# Patient Record
Sex: Female | Born: 1995 | Race: Black or African American | Hispanic: No | Marital: Single | Smoking: Never smoker
Health system: Southern US, Community
[De-identification: ages and names within clinical notes are randomized; demographics above are authoritative.]

---
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
## 2017-11-13 ENCOUNTER — Telehealth (HOSPITAL_COMMUNITY): Payer: Self-pay | Admitting: Emergency Medicine

## 2017-11-13 ENCOUNTER — Ambulatory Visit (HOSPITAL_COMMUNITY)
Admission: EM | Admit: 2017-11-13 | Discharge: 2017-11-13 | Disposition: A | Discharge status: Home / Self Care | Payer: BLUE CROSS/BLUE SHIELD | Attending: Family Medicine | Admitting: Family Medicine

## 2017-11-13 ENCOUNTER — Encounter (HOSPITAL_COMMUNITY): Payer: Self-pay | Admitting: Emergency Medicine

## 2017-11-13 DIAGNOSIS — J029 Acute pharyngitis, unspecified: Secondary | ICD-10-CM | POA: Diagnosis present

## 2017-11-13 DIAGNOSIS — J039 Acute tonsillitis, unspecified: Secondary | ICD-10-CM

## 2017-11-13 LAB — POCT RAPID STREP A: STREPTOCOCCUS, GROUP A SCREEN (DIRECT): NEGATIVE

## 2017-11-13 MED ORDER — AMOXICILLIN 400 MG/5ML PO SUSR: 500.0000 mg | Freq: Two times a day (BID) | ORAL | 0 refills | Status: DC

## 2017-11-13 MED ORDER — AMOXICILLIN 400 MG/5ML PO SUSR: 500.0000 mg | Freq: Two times a day (BID) | ORAL | 0 refills | Status: AC

## 2017-11-13 MED ORDER — ACETAMINOPHEN 160 MG/5ML PO SOLN
ORAL | Status: AC
Start: 2017-11-13 — End: 2017-11-13
  Filled 2017-11-13: qty 20.3

## 2017-11-13 MED ORDER — LIDOCAINE VISCOUS HCL 2 % MT SOLN: OROMUCOSAL | 0 refills | Status: DC

## 2017-11-13 MED ORDER — ACETAMINOPHEN 160 MG/5ML PO SOLN
650.0000 mg | Freq: Once | ORAL | Status: AC
  Administered 2017-11-13: 650 mg via ORAL

## 2017-11-13 NOTE — ED Provider Notes (Signed)
[facility]    CSN: [id number] Arrival date & time: 11/13/17  1920     History   Chief Complaint Chief Complaint  Patient presents with  . Sore Throat    HPI Barbara Wolf is a 22 y.o. female.   22 year old female comes in for 2-day history of sore throat.  Has had subjective fever.  Denies rhinorrhea, nasal congestion, cough.  Has a painful swallowing, and therefore has been avoiding eating or drinking.  Without trouble breathing, drooling, tripoding, trismus.  No obvious sick contact.     History reviewed. No pertinent past medical history.  There are no active problems to display for this patient.   History reviewed. No pertinent surgical history.  OB History   None      Home Medications    Prior to Admission medications   Medication Sig Start Date End Date Taking? Authorizing Provider  amoxicillin (AMOXIL) 400 MG/5ML suspension Take 6.3 mLs (500 mg total) by mouth 2 (two) times daily for 10 days. 11/13/17 11/23/17  Belinda Fisher, PA-C  lidocaine (XYLOCAINE) 2 % solution 5-15 mL gurgle as needed 11/13/17   Belinda Fisher, PA-C    Family History No family history on file.  Social History Social History   Tobacco Use  . Smoking status: Not on file  Substance Use Topics  . Alcohol use: No  . Drug use: Not on file     Allergies   Patient has no known allergies.   Review of Systems Review of Systems  Reason unable to perform ROS: See HPI as above.     Physical Exam Triage Vital Signs ED Triage Vitals  Enc Vitals Group     BP 11/13/17 1955 108/74     Pulse Rate 11/13/17 1953 (!) 130     Resp 11/13/17 1953 18     Temp 11/13/17 1953 (!) 102.9 F (39.4 C)     Temp src --      SpO2 11/13/17 1953 97 %     Weight --      Height --      Head Circumference --      Peak Flow --      Pain Score 11/13/17 1955 10     Pain Loc --      Pain Edu? --      Excl. in GC? --    No data found.  Updated Vital Signs BP 108/74   Pulse (!) 130    Temp (!) 102.9 F (39.4 C)   Resp 18   LMP 11/13/2017   SpO2 97%  Oh Physical Exam  Constitutional: She is oriented to person, place, and time. She appears well-developed and well-nourished.  Non-toxic appearance. She does not appear ill. No distress.  HENT:  Head: Normocephalic and atraumatic.  Right Ear: Tympanic membrane, external ear and ear canal normal. Tympanic membrane is not erythematous and not bulging.  Left Ear: Tympanic membrane, external ear and ear canal normal. Tympanic membrane is not erythematous and not bulging.  Nose: Nose normal. Right sinus exhibits no maxillary sinus tenderness and no frontal sinus tenderness. Left sinus exhibits no maxillary sinus tenderness and no frontal sinus tenderness.  Mouth/Throat: Uvula is midline and mucous membranes are normal. Posterior oropharyngeal erythema present. Tonsils are 2+ on the right. Tonsils are 2+ on the left. Tonsillar exudate.  Eyes: Pupils are equal, round, and reactive to light. Conjunctivae are normal.  Neck: Normal range of motion. Neck supple.  Cardiovascular: Normal rate,  regular rhythm and normal heart sounds. Exam reveals no gallop and no friction rub.  No murmur heard. Pulmonary/Chest: Effort normal and breath sounds normal. No stridor. No respiratory distress. She has no decreased breath sounds. She has no wheezes. She has no rhonchi. She has no rales.  Lymphadenopathy:    She has no cervical adenopathy.  Neurological: She is alert and oriented to person, place, and time.  Skin: Skin is warm and dry.  Psychiatric: She has a normal mood and affect. Her behavior is normal. Judgment normal.     UC Treatments / Results  Labs (all labs ordered are listed, but only abnormal results are displayed) Labs Reviewed  CULTURE, GROUP A STREP [facility])  POCT RAPID STREP A    EKG None  Radiology No results found.  Procedures Procedures (including critical care time)  Medications Ordered in UC Medications    acetaminophen (TYLENOL) solution 650 mg (650 mg Oral Given 11/13/17 1959)    Initial Impression / Assessment and Plan / UC Course  I have reviewed the triage vital signs and the nursing notes.  Pertinent labs & imaging results that were available during my care of the patient were reviewed by me and considered in my medical decision making (see chart for details).    Rapid strep negative.  However, given history and exam, will cover for tonsillitis with amoxicillin.  Other symptomatic treatment discussed.  Return precautions given.  Patient expresses understanding and agrees to plan.  Final Clinical Impressions(s) / UC Diagnoses   Final diagnoses:  Tonsillitis    ED Prescriptions    Medication Sig Dispense Auth. Provider   amoxicillin (AMOXIL) 400 MG/5ML suspension Take 6.3 mLs (500 mg total) by mouth 2 (two) times daily for 10 days. 130 mL Breona Cherubin V, PA-C   lidocaine (XYLOCAINE) 2 % solution 5-15 mL gurgle as needed 150 mL Threasa Alpha, [place] 11/13/17 2017

## 2017-11-13 NOTE — ED Triage Notes (Signed)
Pt c/o sore throat x2 days 

## 2017-11-13 NOTE — Discharge Instructions (Signed)
Rapid strep negative. However, given your exam, will cover you empirically for bacterial infection with amoxicillin. As discussed, symptoms can still be due to viral illness/ drainage down your throat. This usually takes 7-10 days to resolve. Start lidocaine for sore throat, do not eat or drink for the next 40 mins after use as it can stunt your gag reflex. You can take over the counter allergy medicine such as Flonase, Zyrtec-D for nasal congestion/drainage. You can use over the counter nasal saline rinse such as neti pot for nasal congestion. Monitor for any worsening of symptoms, swelling of the throat, trouble breathing, trouble swallowing, leaning forward to breath, drooling, go to the emergency department for further evaluation needed.  

## 2017-11-14 ENCOUNTER — Encounter (HOSPITAL_COMMUNITY): Payer: Self-pay

## 2017-11-14 ENCOUNTER — Emergency Department (HOSPITAL_COMMUNITY)
Admission: EM | Admit: 2017-11-14 | Discharge: 2017-11-14 | Disposition: A | Discharge status: Home / Self Care | Payer: BLUE CROSS/BLUE SHIELD | Attending: Emergency Medicine | Admitting: Emergency Medicine

## 2017-11-14 ENCOUNTER — Other Ambulatory Visit: Payer: Self-pay

## 2017-11-14 DIAGNOSIS — J029 Acute pharyngitis, unspecified: Secondary | ICD-10-CM | POA: Diagnosis not present

## 2017-11-14 DIAGNOSIS — R070 Pain in throat: Secondary | ICD-10-CM | POA: Diagnosis present

## 2017-11-14 LAB — CBC WITH DIFFERENTIAL/PLATELET
Abs Immature Granulocytes: 0.22 10*3/uL — ABNORMAL HIGH (ref 0.00–0.07)
BASOS ABS: 0 10*3/uL (ref 0.0–0.1)
Basophils Relative: 0 %
EOS ABS: 0 10*3/uL (ref 0.0–0.5)
Eosinophils Relative: 0 %
HCT: 35.1 % — ABNORMAL LOW (ref 36.0–46.0)
HEMOGLOBIN: 12 g/dL (ref 12.0–15.0)
Immature Granulocytes: 1 %
LYMPHS ABS: 1 10*3/uL (ref 0.7–4.0)
LYMPHS PCT: 5 %
MCH: 29.6 pg (ref 26.0–34.0)
MCHC: 34.2 g/dL (ref 30.0–36.0)
MCV: 86.5 fL (ref 80.0–100.0)
MONO ABS: 2.7 10*3/uL — AB (ref 0.1–1.0)
Monocytes Relative: 12 %
NRBC: 0 % (ref 0.0–0.2)
Neutro Abs: 18.1 10*3/uL — ABNORMAL HIGH (ref 1.7–7.7)
Neutrophils Relative %: 82 %
Platelets: 180 10*3/uL (ref 150–400)
RBC: 4.06 MIL/uL (ref 3.87–5.11)
RDW: 11.9 % (ref 11.5–15.5)
WBC: 22 10*3/uL — AB (ref 4.0–10.5)

## 2017-11-14 LAB — MONONUCLEOSIS SCREEN: Mono Screen: NEGATIVE

## 2017-11-14 MED ORDER — ACETAMINOPHEN 325 MG PO TABS
650.0000 mg | ORAL_TABLET | Freq: Once | ORAL | Status: DC
  Filled 2017-11-14: qty 2

## 2017-11-14 MED ORDER — ACETAMINOPHEN 160 MG/5ML PO SOLN
650.0000 mg | Freq: Once | ORAL | Status: AC
  Administered 2017-11-14: 650 mg via ORAL
  Filled 2017-11-14: qty 20.3

## 2017-11-14 MED ORDER — DEXAMETHASONE 1 MG/ML PO CONC
10.0000 mg | Freq: Once | ORAL | Status: AC
  Administered 2017-11-14: 10 mg via ORAL
  Filled 2017-11-14: qty 10

## 2017-11-14 MED ORDER — SODIUM CHLORIDE 0.9 % IV BOLUS
1000.0000 mL | Freq: Once | INTRAVENOUS | Status: AC
  Administered 2017-11-14: 1000 mL via INTRAVENOUS

## 2017-11-14 NOTE — ED Provider Notes (Signed)
[facility] EMERGENCY DEPARTMENT Provider Note   CSN: [id number] Arrival date & time: 11/14/17  1454     History   Chief Complaint Chief Complaint  Patient presents with  . Sore Throat    HPI Barbara Wolf is a 22 y.o. female who presents with fever and sore throat. No significant PMH. Mom is at bedside. The patient started to have a sore throat and fever three days ago. They went to UC last night and was prescribed Amoxicillin despite having negative strep due to her physical exam. Today they are back because she is not feeling better and pain in her throat is worsening. She tried lidocaine solution without relief. She denies any SOB but has difficulty swallowing. She reports not eating or drinking in three days. She has been taking Amoxicillin. No known sick contacts.  HPI  History reviewed. No pertinent past medical history.  There are no active problems to display for this patient.   History reviewed. No pertinent surgical history.   OB History   None      Home Medications    Prior to Admission medications   Medication Sig Start Date End Date Taking? Authorizing Provider  amoxicillin (AMOXIL) 400 MG/5ML suspension Take 6.3 mLs (500 mg total) by mouth 2 (two) times daily for 10 days. 11/13/17 11/23/17  Belinda Fisher, PA-C  lidocaine (XYLOCAINE) 2 % solution 5-15 mL gurgle as needed 11/13/17   Belinda Fisher, PA-C    Family History No family history on file.  Social History Social History   Tobacco Use  . Smoking status: Not on file  Substance Use Topics  . Alcohol use: No  . Drug use: Not on file     Allergies   Patient has no known allergies.   Review of Systems Review of Systems  Constitutional: Positive for fever.  HENT: Positive for sore throat and trouble swallowing. Negative for ear pain.   Respiratory: Negative for shortness of breath.   Gastrointestinal: Negative for vomiting.  Allergic/Immunologic: Negative for immunocompromised  state.  All other systems reviewed and are negative.    Physical Exam Updated Vital Signs BP (!) 131/92 (BP Location: Left Arm)   Pulse (!) 133   Temp (!) 100.7 F (38.2 C) (Oral)   Resp 18   Ht 5\' 2"  (1.575 m)   Wt 48.5 kg   LMP 11/13/2017   SpO2 100%   BMI 19.57 kg/m   Physical Exam  Constitutional: She is oriented to person, place, and time. She appears well-developed and well-nourished. No distress.  Calm and cooperative. Not talking.  HENT:  Head: Normocephalic and atraumatic.  Right Ear: Hearing, tympanic membrane, external ear and ear canal normal.  Left Ear: Hearing, tympanic membrane, external ear and ear canal normal.  Nose: Nose normal.  Mouth/Throat: Uvula is midline. Mucous membranes are dry. Oropharyngeal exudate, posterior oropharyngeal edema and posterior oropharyngeal erythema present. No tonsillar abscesses. Tonsils are 3+ on the right. Tonsils are 3+ on the left. Tonsillar exudate.  Eyes: Pupils are equal, round, and reactive to light. Conjunctivae are normal. Right eye exhibits no discharge. Left eye exhibits no discharge. No scleral icterus.  Neck: Normal range of motion.  Cardiovascular: Normal rate.  Pulmonary/Chest: Effort normal. No respiratory distress.  Abdominal: She exhibits no distension.  Neurological: She is alert and oriented to person, place, and time.  Skin: Skin is warm and dry.  Psychiatric: She has a normal mood and affect. Her behavior is normal.  Nursing  note and vitals reviewed.    ED Treatments / Results  Labs (all labs ordered are listed, but only abnormal results are displayed) Labs Reviewed  CBC WITH DIFFERENTIAL/PLATELET - Abnormal; Notable for the following components:      Result Value   WBC 22.0 (*)    HCT 35.1 (*)    Neutro Abs 18.1 (*)    Monocytes Absolute 2.7 (*)    Abs Immature Granulocytes 0.22 (*)    All other components within normal limits  MONONUCLEOSIS SCREEN    EKG None  Radiology No results  found.  Procedures Procedures (including critical care time)  Medications Ordered in ED Medications  acetaminophen (TYLENOL) solution 650 mg (650 mg Oral Given 11/14/17 1522)  sodium chloride 0.9 % bolus 1,000 mL (0 mLs Intravenous Stopped 11/14/17 1726)  dexamethasone (DECADRON) 1 MG/ML solution 10 mg (10 mg Oral Given 11/14/17 1701)     Initial Impression / Assessment and Plan / ED Course  I have reviewed the triage vital signs and the nursing notes.  Pertinent labs & imaging results that were available during my care of the patient were reviewed by me and considered in my medical decision making (see chart for details).  22 year old female with fever and sore throat for three days. She is febrile and tachycardic to 130 here. She is tolerating secretions but not talking. On exam she has an significantly swollen oropharynx and tonsils. No uvula deviation so doubt PTA at this time. CBC and mono screen were ordered by [facility]. CBC is remarkable for leukocytosis of 22 with neutrophils and monocytes. Strep was not repeated since she has been taking Amoxil. Since she has not ate or drank in 3 days and her heart rate is still elevated after fever has resolved will give fluids, decadron, and reassess.  Fever and tachycardia have resolved with fluids and Tylenol. She has tolerated PO here. Mono is negative. Strep culture is still pending. Advised continuing Amoxil, encourage fluids, and f/u with ENT next week. Strict return precautions given.  Final Clinical Impressions(s) / ED Diagnoses   Final diagnoses:  Pharyngitis, unspecified etiology    ED Discharge Orders    None       Bethel Born, PA-C 11/14/17 1845    Raeford Razor, MD 11/15/17 [phone]

## 2017-11-14 NOTE — Discharge Instructions (Signed)
Please continue Amoxicillin Drink plenty of fluids and encourage soft foods (Gatorade, Pedialyte, yogurt, ice cream, soup) Give Tylenol for fever and pain Follow up with ENT if not improving Return to the ED if you are worsening

## 2017-11-14 NOTE — ED Provider Notes (Signed)
Patient placed in Quick Look pathway, seen and evaluated   Chief Complaint: sore throat and fever  HPI: Barbara Wolf is a 22 y.o. female who presents to the ED with sore throat and fever that started 3 days ago and has gotten worse. Patient went to Urgent Care 2 days ago and tested negative for strep but was started on Amoxicillin. Patient crying today and states she feels worse.   ROS: ENT: sore throat  Physical Exam:  BP (!) 131/92 (BP Location: Left Arm)   Pulse (!) 133   Temp (!) 100.7 F (38.2 C) (Oral)   Resp 18   Ht 5\' 2"  (1.575 m)   Wt 48.5 kg   LMP 11/13/2017   SpO2 100%   BMI 19.57 kg/m    Gen: No distress  Neuro: Awake and Alert  Skin: Warm and dry  ENT: throat with erythema and exudate bilateral   Initiation of care has begun. The patient has been counseled on the process, plan, and necessity for staying for the completion/evaluation, and the remainder of the medical screening examination    Janne Napoleon, NP 11/14/17 1507    Lorre Nick, MD 11/15/17 [phone]

## 2017-11-14 NOTE — ED Triage Notes (Signed)
Pt presents for sore throat and fever x 3 days.

## 2017-11-16 LAB — CULTURE, GROUP A STREP (THRC)

## 2018-09-27 DIAGNOSIS — H5203 Hypermetropia, bilateral: Secondary | ICD-10-CM | POA: Diagnosis not present

## 2018-11-14 ENCOUNTER — Ambulatory Visit (INDEPENDENT_AMBULATORY_CARE_PROVIDER_SITE_OTHER): Payer: 59 | Admitting: Obstetrics & Gynecology

## 2018-11-14 ENCOUNTER — Encounter: Payer: Self-pay | Admitting: Obstetrics & Gynecology

## 2018-11-14 ENCOUNTER — Other Ambulatory Visit: Payer: Self-pay

## 2018-11-14 VITALS — BP 107/76 | HR 87 | Wt 123.5 lb

## 2018-11-14 DIAGNOSIS — Z113 Encounter for screening for infections with a predominantly sexual mode of transmission: Secondary | ICD-10-CM | POA: Diagnosis not present

## 2018-11-14 DIAGNOSIS — Z124 Encounter for screening for malignant neoplasm of cervix: Secondary | ICD-10-CM

## 2018-11-14 DIAGNOSIS — Z01419 Encounter for gynecological examination (general) (routine) without abnormal findings: Secondary | ICD-10-CM | POA: Diagnosis not present

## 2018-11-14 NOTE — Progress Notes (Signed)
Subjective:    Barbara Wolf is a 23 y.o. single G0 who presents for an annual exam. The patient has no complaints today. The patient is sexually active. GYN screening history: no prior history of gyn screening tests. The patient wears seatbelts: yes. The patient participates in regular exercise: yes. Has the patient ever been transfused or tattooed?: no. The patient reports that there is not domestic violence in her life.   Menstrual History: OB History   No obstetric history on file.     Menarche age: 88 Patient's last menstrual period was 11/11/2018 (exact date). Period Cycle (Days): 27 Period Duration (Days): 7 Period Pattern: Regular Menstrual Flow: Moderate Menstrual Control: Tampon, Maxi pad Menstrual Control Change Freq (Hours): every 3 hours Dysmenorrhea: (!) Mild Dysmenorrhea Symptoms: Cramping  The following portions of the patient's history were reviewed and updated as appropriate: allergies, current medications, past family history, past medical history, past social history, past surgical history and problem list.  Review of Systems Pertinent items are noted in HPI.   Senior at [employer], studying psychology, plans grad school Monogamous for 4 months, no dyspareunia Using condoms faithfully [place]- + breast- great gm, no gyn/colon cancer She had Gardasil in the past    Objective:    BP 107/76   Pulse 87   Wt 123 lb 8 oz (56 kg)   LMP 11/11/2018 (Exact Date)   BMI 22.59 kg/m   General Appearance:    Alert, cooperative, no distress, appears stated age  Head:    Normocephalic, without obvious abnormality, atraumatic  Eyes:    PERRL, conjunctiva/corneas clear, EOM's intact, fundi    benign, both eyes  Ears:    Normal TM's and external ear canals, both ears  Nose:   Nares normal, septum midline, mucosa normal, no drainage    or sinus tenderness  Throat:   Lips, mucosa, and tongue normal; teeth and gums normal  Neck:   Supple, symmetrical, trachea midline, no adenopathy;     thyroid:  no enlargement/tenderness/nodules; no carotid   bruit or JVD  Back:     Symmetric, no curvature, ROM normal, no CVA tenderness  Lungs:     Clear to auscultation bilaterally, respirations unlabored  Chest Wall:    No tenderness or deformity   Heart:    Regular rate and rhythm, S1 and S2 normal, no murmur, rub   or gallop  Breast Exam:    No tenderness, masses, or nipple abnormality  Abdomen:     Soft, non-tender, bowel sounds active all four quadrants,    no masses, no organomegaly  Genitalia:    Normal female without lesion, discharge or tenderness, normal size and shape, retroverted, mobile, non-tender, normal adnexal exam      Extremities:   Extremities normal, atraumatic, no cyanosis or edema  Pulses:   2+ and symmetric all extremities  Skin:   Skin color, texture, turgor normal, no rashes or lesions  Lymph nodes:   Cervical, supraclavicular, and axillary nodes normal  Neurologic:   CNII-XII intact, normal strength, sensation and reflexes    throughout  .    Assessment:    Healthy female exam.    Plan:     Thin prep Pap smear.   Patient requests STI testing ( wants HSV also)

## 2018-11-15 LAB — HEPATITIS C ANTIBODY: Hep C Virus Ab: <0.1 s/co ratio (ref 0.0–0.9)

## 2018-11-15 LAB — RPR: RPR Ser Ql: NONREACTIVE

## 2018-11-15 LAB — HEPATITIS B SURFACE ANTIGEN: Hepatitis B Surface Ag: NEGATIVE

## 2018-11-15 LAB — HIV ANTIBODY (ROUTINE TESTING W REFLEX): HIV Screen 4th Generation wRfx: NONREACTIVE

## 2018-11-15 LAB — HSV 2 ANTIBODY, IGG: HSV 2 IgG, Type Spec: <0.91 index (ref 0.00–0.90)

## 2018-11-19 LAB — CYTOLOGY - PAP
Chlamydia: NEGATIVE
Comment: NEGATIVE
Comment: NORMAL
Diagnosis: NEGATIVE
Diagnosis: REACTIVE
Neisseria Gonorrhea: NEGATIVE

## 2019-02-25 ENCOUNTER — Other Ambulatory Visit: Payer: Self-pay

## 2019-02-25 ENCOUNTER — Ambulatory Visit (INDEPENDENT_AMBULATORY_CARE_PROVIDER_SITE_OTHER): Payer: Self-pay | Admitting: Nurse Practitioner

## 2019-02-25 ENCOUNTER — Encounter: Payer: Self-pay | Admitting: Nurse Practitioner

## 2019-02-25 VITALS — BP 107/77 | HR 96 | Ht 63.0 in | Wt 125.9 lb

## 2019-02-25 DIAGNOSIS — B9689 Other specified bacterial agents as the cause of diseases classified elsewhere: Secondary | ICD-10-CM

## 2019-02-25 DIAGNOSIS — Z113 Encounter for screening for infections with a predominantly sexual mode of transmission: Secondary | ICD-10-CM

## 2019-02-25 DIAGNOSIS — N898 Other specified noninflammatory disorders of vagina: Secondary | ICD-10-CM

## 2019-02-25 DIAGNOSIS — N760 Acute vaginitis: Secondary | ICD-10-CM

## 2019-02-25 NOTE — Progress Notes (Signed)
Pt states has been having thick white d/c with odor for a couple of weeks now.

## 2019-02-25 NOTE — Progress Notes (Signed)
   GYNECOLOGY OFFICE VISIT NOTE   History:  24 y.o. No obstetric history on file. here today for vaginal discharge with a fishy odor that comes and goes for the past few weeks. She denies any abnormal vaginal discharge, bleeding, pelvic pain or other concerns.   Menstrual cycles are 6 days in length with heavy bleeding at the beginning.  Uses condoms for contraception.  Last intercourse was in November 2020  History reviewed. No pertinent past medical history.  History reviewed. No pertinent surgical history.  The following portions of the patient's history were reviewed and updated as appropriate: allergies, current medications, past family history, past medical history, past social history, past surgical history and problem list.   Health Maintenance:  Normal pap and negative HRHPV in Oct 2020    Review of Systems:  Pertinent items noted in HPI and remainder of comprehensive ROS otherwise negative.  Objective:  Physical Exam BP 107/77   Pulse 96   Ht 5\' 3"  (1.6 m)   Wt 125 lb 14.4 oz (57.1 kg)   LMP 02/11/2019 (Approximate)   BMI 22.30 kg/m  CONSTITUTIONAL: Well-developed, well-nourished female in no acute distress.  HENT:  Normocephalic, atraumatic. External right and left ear normal.  EYES: Conjunctivae and EOM are normal. Pupils are equal, round.  No scleral icterus.  NECK: Normal range of motion, supple, no masses SKIN: Skin is warm and dry. No rash noted. Not diaphoretic. No erythema. No pallor. NEUROLOGIC: Alert and oriented to person, place, and time. Normal muscle tone coordination. No cranial nerve deficit noted. PSYCHIATRIC: Normal mood and affect. Normal behavior. Normal judgment and thought content. CARDIOVASCULAR: Normal heart rate noted RESPIRATORY: Effort and breath sounds normal, no problems with respiration noted ABDOMEN: Soft, no distention noted.   PELVIC: Deferred  Self swab performed. MUSCULOSKELETAL: Normal range of motion. No edema noted.  Labs and  Imaging No results found.  Assessment & Plan:  1. Discharge from the vagina Will contact client with results through MyChart later this week. - Cervicovaginal ancillary only( [place])   Routine preventative health maintenance measures emphasized. Please refer to After Visit Summary for other counseling recommendations.   Return in about 9 months (around 11/16/2019) for annual exam.   Total face-to-face time with patient: 10 minutes.  Over 50% of encounter was spent on counseling and coordination of care.  13/01/2019, RN, MSN, NP-BC Nurse Practitioner, [facility] 02/25/2019 3:44 PM

## 2019-02-26 LAB — CERVICOVAGINAL ANCILLARY ONLY
Bacterial Vaginitis (gardnerella): POSITIVE — AB
Candida Glabrata: NEGATIVE
Candida Vaginitis: NEGATIVE
Chlamydia: NEGATIVE
Comment: NEGATIVE
Comment: NEGATIVE
Comment: NEGATIVE
Comment: NEGATIVE
Comment: NEGATIVE
Comment: NORMAL
Neisseria Gonorrhea: NEGATIVE
Trichomonas: NEGATIVE

## 2019-02-26 MED ORDER — METRONIDAZOLE 500 MG PO TABS: 500.0000 mg | ORAL_TABLET | Freq: Two times a day (BID) | ORAL | 0 refills | Status: DC

## 2019-02-26 NOTE — Addendum Note (Signed)
Addended by: Currie Paris on: 02/26/2019 08:33 PM   Modules accepted: Orders

## 2019-08-13 ENCOUNTER — Telehealth (INDEPENDENT_AMBULATORY_CARE_PROVIDER_SITE_OTHER): Payer: No Typology Code available for payment source | Admitting: Internal Medicine

## 2019-08-13 ENCOUNTER — Encounter: Payer: Self-pay | Admitting: Internal Medicine

## 2019-08-13 DIAGNOSIS — Z7689 Persons encountering health services in other specified circumstances: Secondary | ICD-10-CM

## 2019-08-13 NOTE — Patient Instructions (Signed)
Thank you for choosing [facility] at [facility] to be your medical home!    Barbara Wolf was seen by De Hollingshead, DO today.   Barbara Wolf's primary care provider is Marcy Siren, DO.   For the best care possible, you should try to see Marcy Siren, DO whenever you come to the clinic.   We look forward to seeing you again soon!  If you have any questions about your visit today, please call us at [phone] or feel free to reach your primary care provider via MyChart.

## 2019-08-13 NOTE — Progress Notes (Signed)
Virtual Visit via Telephone Note  I connected with Barbara Wolf, on 08/13/2019 at 8:45 AM by telephone due to the COVID-19 pandemic and verified that I am speaking with the correct person using two identifiers.   Consent: I discussed the limitations, risks, security and privacy concerns of performing an evaluation and management service by telephone and the availability of in person appointments. I also discussed with the patient that there may be a patient responsible charge related to this service. The patient expressed understanding and agreed to proceed.   Location of Patient: Home   Location of Provider: Clinic    Persons participating in Telemedicine visit: Dejae T Janyia Guion [facility] Dr. Earlene Plater      History of Present Illness: Patient has a visit to establish care. No concerns. Looking for a new PCP. Wants to get a physical exam and PPD test done for a new job.   Was diagnosed with asthma in 7th or 8th grade in [place] but has not been told she has it since then. Does not use an inhaler.   No surgical history. No prescription medications.    No past medical history on file. No Known Allergies  No current outpatient medications on file prior to visit.   No current facility-administered medications on file prior to visit.    Observations/Objective: NAD. Speaking clearly.  Work of breathing normal.  Alert and oriented. Mood appropriate.   Assessment and Plan: 1. Encounter to establish care Reviewed patient's PMH, social history, surgical history, and medications.  Is overdue for annual exam, screening blood work, and health maintenance topics. Have asked patient to return for visit to address these items.    Follow Up Instructions: Annual exam   I discussed the assessment and treatment plan with the patient. The patient was provided an opportunity to ask questions and all were answered. The patient agreed with the plan and demonstrated an  understanding of the instructions.   The patient was advised to call back or seek an in-person evaluation if the symptoms worsen or if the condition fails to improve as anticipated.     I provided 8 minutes total of non-face-to-face time during this encounter including median intraservice time, reviewing previous notes, investigations, ordering medications, medical decision making, coordinating care and patient verbalized understanding at the end of the visit.    Marcy Siren, D.O. [facility]  08/13/2019, 8:45 AM

## 2019-08-15 DIAGNOSIS — Z111 Encounter for screening for respiratory tuberculosis: Secondary | ICD-10-CM | POA: Diagnosis not present

## 2019-08-15 DIAGNOSIS — Z021 Encounter for pre-employment examination: Secondary | ICD-10-CM | POA: Diagnosis not present

## 2019-08-17 DIAGNOSIS — Z111 Encounter for screening for respiratory tuberculosis: Secondary | ICD-10-CM | POA: Diagnosis not present

## 2019-08-22 DIAGNOSIS — H5203 Hypermetropia, bilateral: Secondary | ICD-10-CM | POA: Diagnosis not present

## 2019-09-17 DIAGNOSIS — Z20822 Contact with and (suspected) exposure to covid-19: Secondary | ICD-10-CM | POA: Diagnosis not present

## 2019-10-01 ENCOUNTER — Ambulatory Visit (INDEPENDENT_AMBULATORY_CARE_PROVIDER_SITE_OTHER): Payer: No Typology Code available for payment source | Admitting: Internal Medicine

## 2019-12-09 DIAGNOSIS — Z20822 Contact with and (suspected) exposure to covid-19: Secondary | ICD-10-CM | POA: Diagnosis not present

## 2020-02-25 DIAGNOSIS — Z20822 Contact with and (suspected) exposure to covid-19: Secondary | ICD-10-CM | POA: Diagnosis not present

## 2020-07-31 DIAGNOSIS — Z20822 Contact with and (suspected) exposure to covid-19: Secondary | ICD-10-CM | POA: Diagnosis not present

## 2020-08-21 DIAGNOSIS — H5203 Hypermetropia, bilateral: Secondary | ICD-10-CM | POA: Diagnosis not present

## 2020-11-28 DIAGNOSIS — R69 Illness, unspecified: Secondary | ICD-10-CM | POA: Diagnosis not present

## 2020-12-12 DIAGNOSIS — R69 Illness, unspecified: Secondary | ICD-10-CM | POA: Diagnosis not present

## 2020-12-27 DIAGNOSIS — R69 Illness, unspecified: Secondary | ICD-10-CM | POA: Diagnosis not present

## 2021-01-19 DIAGNOSIS — R69 Illness, unspecified: Secondary | ICD-10-CM | POA: Diagnosis not present

## 2021-01-26 DIAGNOSIS — R69 Illness, unspecified: Secondary | ICD-10-CM | POA: Diagnosis not present

## 2021-02-02 DIAGNOSIS — R69 Illness, unspecified: Secondary | ICD-10-CM | POA: Diagnosis not present

## 2021-02-06 ENCOUNTER — Emergency Department (HOSPITAL_COMMUNITY): Payer: No Typology Code available for payment source

## 2021-02-06 ENCOUNTER — Other Ambulatory Visit: Payer: Self-pay

## 2021-02-06 ENCOUNTER — Emergency Department (HOSPITAL_COMMUNITY)
Admission: EM | Admit: 2021-02-06 | Discharge: 2021-02-06 | Disposition: A | Discharge status: Home / Self Care | Payer: No Typology Code available for payment source | Attending: Emergency Medicine | Admitting: Emergency Medicine

## 2021-02-06 ENCOUNTER — Encounter (HOSPITAL_COMMUNITY): Payer: Self-pay

## 2021-02-06 DIAGNOSIS — R079 Chest pain, unspecified: Secondary | ICD-10-CM | POA: Diagnosis not present

## 2021-02-06 DIAGNOSIS — R0789 Other chest pain: Secondary | ICD-10-CM | POA: Insufficient documentation

## 2021-02-06 DIAGNOSIS — M79602 Pain in left arm: Secondary | ICD-10-CM | POA: Diagnosis not present

## 2021-02-06 DIAGNOSIS — R200 Anesthesia of skin: Secondary | ICD-10-CM | POA: Diagnosis not present

## 2021-02-06 DIAGNOSIS — R0602 Shortness of breath: Secondary | ICD-10-CM | POA: Diagnosis not present

## 2021-02-06 DIAGNOSIS — R000 Tachycardia, unspecified: Secondary | ICD-10-CM | POA: Diagnosis not present

## 2021-02-06 DIAGNOSIS — N9489 Other specified conditions associated with female genital organs and menstrual cycle: Secondary | ICD-10-CM | POA: Insufficient documentation

## 2021-02-06 LAB — CBC
HCT: 37.6 % (ref 36.0–46.0)
Hemoglobin: 12.8 g/dL (ref 12.0–15.0)
MCH: 29.7 pg (ref 26.0–34.0)
MCHC: 34 g/dL (ref 30.0–36.0)
MCV: 87.2 fL (ref 80.0–100.0)
Platelets: 260 10*3/uL (ref 150–400)
RBC: 4.31 MIL/uL (ref 3.87–5.11)
RDW: 12 % (ref 11.5–15.5)
WBC: 4.8 10*3/uL (ref 4.0–10.5)
nRBC: 0 % (ref 0.0–0.2)

## 2021-02-06 LAB — I-STAT BETA HCG BLOOD, ED (MC, WL, AP ONLY): I-stat hCG, quantitative: <5 m[IU]/mL (ref <5)

## 2021-02-06 LAB — BASIC METABOLIC PANEL
Anion gap: 6 (ref 5–15)
BUN: 10 mg/dL (ref 6–20)
CO2: 27 mmol/L (ref 22–32)
Calcium: 9.5 mg/dL (ref 8.9–10.3)
Chloride: 105 mmol/L (ref 98–111)
Creatinine, Ser: 0.64 mg/dL (ref 0.44–1.00)
GFR, Estimated: >60 mL/min (ref >60)
Glucose, Bld: 98 mg/dL (ref 70–99)
Potassium: 3.7 mmol/L (ref 3.5–5.1)
Sodium: 138 mmol/L (ref 135–145)

## 2021-02-06 LAB — D-DIMER, QUANTITATIVE: D-Dimer, Quant: <0.27 ug/mL-FEU (ref 0.00–0.50)

## 2021-02-06 LAB — TROPONIN I (HIGH SENSITIVITY)
Troponin I (High Sensitivity): <2 ng/L (ref <18)
Troponin I (High Sensitivity): <2 ng/L (ref <18)

## 2021-02-06 NOTE — ED Provider Notes (Signed)
[facility]-EMERGENCY DEPT Provider Note   CSN: [id number] Arrival date & time: 02/06/21  1635     History  Chief Complaint  Patient presents with   Chest Pain   Shortness of Breath    Barbara Wolf is a 26 y.o. female.  She is here with a complaint of an episode of chest pain shortness of breath with tingling and aching into her left arm.  The tingling then generalized over her head.  This occurred while doing regular activity at work.  It has resolved now.  It was not associated with any heartburn sensation.  She said the pain was a squeezing sensation and also a sharp sensation.  No prior history of cardiac issues.  The history is provided by the patient and a parent.  Chest Pain Pain location:  Substernal area Pain quality: sharp   Pain quality comment:  Squeezing Pain radiates to:  L arm Pain severity:  Moderate Onset quality:  Gradual Duration:  2 hours Timing:  Constant Progression:  Resolved Chronicity:  New Relieved by:  None tried Worsened by:  Nothing Ineffective treatments:  None tried Associated symptoms: shortness of breath   Associated symptoms: no abdominal pain, no back pain, no cough, no diaphoresis, no dysphagia, no fever, no headache, no nausea and no vomiting   Risk factors: no smoking   Shortness of Breath Associated symptoms: chest pain   Associated symptoms: no abdominal pain, no cough, no diaphoresis, no fever, no headaches, no rash, no sore throat and no vomiting       Home Medications Prior to Admission medications   Not on File      Allergies    Patient has no known allergies.    Review of Systems   Review of Systems  Constitutional:  Negative for diaphoresis and fever.  HENT:  Negative for sore throat and trouble swallowing.   Eyes:  Negative for visual disturbance.  Respiratory:  Positive for shortness of breath. Negative for cough.   Cardiovascular:  Positive for chest pain.  Gastrointestinal:  Negative for  abdominal pain, nausea and vomiting.  Genitourinary:  Negative for dysuria.  Musculoskeletal:  Negative for back pain.  Skin:  Negative for rash.  Neurological:  Negative for headaches.   Physical Exam Updated Vital Signs BP (!) 129/91    Pulse (!) 111    Temp 98 F (36.7 C) (Oral)    Resp 18    LMP 01/23/2021    SpO2 100%  Physical Exam Vitals and nursing note reviewed.  Constitutional:      General: She is not in acute distress.    Appearance: She is well-developed.  HENT:     Head: Normocephalic and atraumatic.  Eyes:     Conjunctiva/sclera: Conjunctivae normal.  Cardiovascular:     Rate and Rhythm: Regular rhythm. Tachycardia present.     Heart sounds: Normal heart sounds. No murmur heard. Pulmonary:     Effort: Pulmonary effort is normal. No respiratory distress.     Breath sounds: Normal breath sounds.  Abdominal:     Palpations: Abdomen is soft.     Tenderness: There is no abdominal tenderness.  Musculoskeletal:        General: No swelling. Normal range of motion.     Cervical back: Neck supple.     Right lower leg: No tenderness. No edema.     Left lower leg: No tenderness. No edema.  Skin:    General: Skin is warm and dry.  Capillary Refill: Capillary refill takes less than 2 seconds.  Neurological:     General: No focal deficit present.     Mental Status: She is alert.    ED Results / Procedures / Treatments   Labs (all labs ordered are listed, but only abnormal results are displayed) Labs Reviewed  BASIC METABOLIC PANEL  CBC  D-DIMER, QUANTITATIVE  I-STAT BETA HCG BLOOD, ED ([facility], [facility], [facility] ONLY)  TROPONIN I (HIGH SENSITIVITY)  TROPONIN I (HIGH SENSITIVITY)    EKG EKG Interpretation  Date/Time:  Monday February 06 2021 16:44:50 EST Ventricular Rate:  115 PR Interval:  131 QRS Duration: 87 QT Interval:  333 QTC Calculation: 461 R Axis:   71 Text Interpretation: Sinus tachycardia Biatrial enlargement Abnormal T, consider ischemia, anterior leads No  old tracing to compare Confirmed by Meridee Score [phone]) on 02/06/2021 9:28:29 PM  Radiology DG Chest 2 View  Result Date: 02/06/2021 CLINICAL DATA:  chest painPt presents with chest pain, SHOB, left arm pain and numbness to her neck that began about 45 minutes. Denies back pain and N/V/D. EXAM: CHEST - 2 VIEW COMPARISON:  None. FINDINGS: Numerous leads and wires project over the chest. Midline trachea. Normal heart size and mediastinal contours. No pleural effusion or pneumothorax. Clear lungs. IMPRESSION: No active cardiopulmonary disease. Electronically Signed   By: Jeronimo Greaves M.D.   On: 02/06/2021 17:45    Procedures Procedures    Medications Ordered in ED Medications - No data to display  ED Course/ Medical Decision Making/ A&P                           Medical Decision Making Amount and/or Complexity of Data Reviewed Labs: ordered. Radiology: ordered.  This patient complains of pain radiating to left arm associated with some numbness and shortness of breath; this involves an extensive number of treatment Options and is a complaint that carries with it a high risk of complications and Morbidity. The differential includes ACS, pneumonia, PE, pneumothorax, musculoskeletal, GERD, anxiety, vascular  I ordered, reviewed and interpreted labs, which included normal CBC normal chemistries with flat troponins D-dimer negative pregnancy test negative  I ordered imaging studies which included chest x-ray and I independently    visualized and interpreted imaging which showed no acute findings Additional history obtained from patient's mother Previous records obtained and reviewed in epic no prior presentations of similar  After the interventions stated above, I reevaluated the patient and found patient to be very well-appearing.  Still having some borderline tachycardia tachypnea, no distress.  No indications for admission at this time.  Reviewed results with her and her mother.  They  are comfortable plan for outpatient evaluation by her treating provider.  Return instructions discussed          Final Clinical Impression(s) / ED Diagnoses Final diagnoses:  Atypical chest pain    Rx / DC Orders ED Discharge Orders     None         Terrilee Files, MD 02/07/21 [phone]

## 2021-02-06 NOTE — ED Provider Triage Note (Signed)
Emergency Medicine Provider Triage Evaluation Note  Barbara Wolf , a 26 y.o. female  was evaluated in triage.  Pt complains of chest pain, shortness of breath, and left sided numbness. She states that same began 1 hour ago with chest pain on the left side of her chest that 'feels like someone is squeezing my heart' pain is exertional in nature and does not radiate. Also with left sided numbness in her left arm, up her left neck and into her whole face. No cardiac or medical hx.  Review of Systems  Positive:  Negative: See above  Physical Exam  BP (!) 150/103 (BP Location: Right Arm)    Pulse (!) 117    Temp 98 F (36.7 C) (Oral)    Resp 18    SpO2 100%  Gen:   Awake, no distress  tearful Resp:  Normal effort  MSK:   Moves extremities without difficulty  Other:  Overall neurologically intact  Medical Decision Making  Medically screening exam initiated at 5:02 PM.  Appropriate orders placed.  Lona Millard was informed that the remainder of the evaluation will be completed by another provider, this initial triage assessment does not replace that evaluation, and the importance of remaining in the ED until their evaluation is complete.     Silva Bandy, PA-C 02/06/21 1706

## 2021-02-06 NOTE — Discharge Instructions (Signed)
You were seen in the emergency department for chest pain shortness of breath and tingling in your arm and head.  You had blood work EKG chest x-ray that did not show any evidence of heart attack or blood clot.  Please follow-up with your primary care doctor.  Return to the emergency department if any worsening or concerning symptoms.

## 2021-02-06 NOTE — ED Triage Notes (Signed)
Pt presents with chest pain, SHOB, left arm pain and numbness to her neck that began about 45 minutes. Denies back pain and N/V/D.

## 2021-02-09 DIAGNOSIS — R69 Illness, unspecified: Secondary | ICD-10-CM | POA: Diagnosis not present

## 2021-02-16 DIAGNOSIS — R69 Illness, unspecified: Secondary | ICD-10-CM | POA: Diagnosis not present

## 2021-02-23 DIAGNOSIS — R69 Illness, unspecified: Secondary | ICD-10-CM | POA: Diagnosis not present

## 2021-02-28 DIAGNOSIS — Z20822 Contact with and (suspected) exposure to covid-19: Secondary | ICD-10-CM | POA: Diagnosis not present

## 2021-02-28 DIAGNOSIS — Z03818 Encounter for observation for suspected exposure to other biological agents ruled out: Secondary | ICD-10-CM | POA: Diagnosis not present

## 2021-03-09 DIAGNOSIS — R69 Illness, unspecified: Secondary | ICD-10-CM | POA: Diagnosis not present

## 2021-03-23 DIAGNOSIS — R69 Illness, unspecified: Secondary | ICD-10-CM | POA: Diagnosis not present

## 2021-03-25 DIAGNOSIS — Z708 Other sex counseling: Secondary | ICD-10-CM | POA: Diagnosis not present

## 2021-03-25 DIAGNOSIS — R69 Illness, unspecified: Secondary | ICD-10-CM | POA: Diagnosis not present

## 2021-04-06 DIAGNOSIS — R69 Illness, unspecified: Secondary | ICD-10-CM | POA: Diagnosis not present

## 2021-04-20 DIAGNOSIS — R69 Illness, unspecified: Secondary | ICD-10-CM | POA: Diagnosis not present

## 2021-05-04 DIAGNOSIS — R69 Illness, unspecified: Secondary | ICD-10-CM | POA: Diagnosis not present

## 2021-05-18 DIAGNOSIS — R69 Illness, unspecified: Secondary | ICD-10-CM | POA: Diagnosis not present

## 2021-06-01 DIAGNOSIS — R69 Illness, unspecified: Secondary | ICD-10-CM | POA: Diagnosis not present

## 2021-07-13 ENCOUNTER — Ambulatory Visit: Payer: No Typology Code available for payment source | Admitting: Family Medicine

## 2021-08-10 DIAGNOSIS — R69 Illness, unspecified: Secondary | ICD-10-CM | POA: Diagnosis not present

## 2021-09-13 ENCOUNTER — Emergency Department (HOSPITAL_COMMUNITY): Payer: No Typology Code available for payment source

## 2021-09-13 ENCOUNTER — Encounter (HOSPITAL_COMMUNITY): Payer: Self-pay

## 2021-09-13 ENCOUNTER — Emergency Department (HOSPITAL_COMMUNITY)
Admission: EM | Admit: 2021-09-13 | Discharge: 2021-09-13 | Disposition: A | Discharge status: Home / Self Care | Payer: No Typology Code available for payment source | Attending: Emergency Medicine | Admitting: Emergency Medicine

## 2021-09-13 DIAGNOSIS — R1031 Right lower quadrant pain: Secondary | ICD-10-CM | POA: Insufficient documentation

## 2021-09-13 DIAGNOSIS — R110 Nausea: Secondary | ICD-10-CM | POA: Diagnosis not present

## 2021-09-13 DIAGNOSIS — R109 Unspecified abdominal pain: Secondary | ICD-10-CM | POA: Diagnosis not present

## 2021-09-13 LAB — COMPREHENSIVE METABOLIC PANEL
ALT: 13 U/L (ref 0–44)
AST: 16 U/L (ref 15–41)
Albumin: 4.2 g/dL (ref 3.5–5.0)
Alkaline Phosphatase: 41 U/L (ref 38–126)
Anion gap: 7 (ref 5–15)
BUN: 14 mg/dL (ref 6–20)
CO2: 23 mmol/L (ref 22–32)
Calcium: 9.3 mg/dL (ref 8.9–10.3)
Chloride: 109 mmol/L (ref 98–111)
Creatinine, Ser: 0.57 mg/dL (ref 0.44–1.00)
GFR, Estimated: >60 mL/min (ref >60)
Glucose, Bld: 95 mg/dL (ref 70–99)
Potassium: 4.2 mmol/L (ref 3.5–5.1)
Sodium: 139 mmol/L (ref 135–145)
Total Bilirubin: 0.5 mg/dL (ref 0.3–1.2)
Total Protein: 7.3 g/dL (ref 6.5–8.1)

## 2021-09-13 LAB — CBC WITH DIFFERENTIAL/PLATELET
Abs Immature Granulocytes: 0.01 10*3/uL (ref 0.00–0.07)
Basophils Absolute: 0 10*3/uL (ref 0.0–0.1)
Basophils Relative: 1 %
Eosinophils Absolute: 0.1 10*3/uL (ref 0.0–0.5)
Eosinophils Relative: 3 %
HCT: 36.6 % (ref 36.0–46.0)
Hemoglobin: 12.5 g/dL (ref 12.0–15.0)
Immature Granulocytes: 0 %
Lymphocytes Relative: 29 %
Lymphs Abs: 1.2 10*3/uL (ref 0.7–4.0)
MCH: 30.3 pg (ref 26.0–34.0)
MCHC: 34.2 g/dL (ref 30.0–36.0)
MCV: 88.6 fL (ref 80.0–100.0)
Monocytes Absolute: 0.5 10*3/uL (ref 0.1–1.0)
Monocytes Relative: 11 %
Neutro Abs: 2.4 10*3/uL (ref 1.7–7.7)
Neutrophils Relative %: 56 %
Platelets: 253 10*3/uL (ref 150–400)
RBC: 4.13 MIL/uL (ref 3.87–5.11)
RDW: 12.7 % (ref 11.5–15.5)
WBC: 4.2 10*3/uL (ref 4.0–10.5)
nRBC: 0 % (ref 0.0–0.2)

## 2021-09-13 LAB — I-STAT CHEM 8, ED
BUN: 13 mg/dL (ref 6–20)
Calcium, Ion: 1.24 mmol/L (ref 1.15–1.40)
Chloride: 105 mmol/L (ref 98–111)
Creatinine, Ser: 0.5 mg/dL (ref 0.44–1.00)
Glucose, Bld: 91 mg/dL (ref 70–99)
HCT: 36 % (ref 36.0–46.0)
Hemoglobin: 12.2 g/dL (ref 12.0–15.0)
Potassium: 4.3 mmol/L (ref 3.5–5.1)
Sodium: 141 mmol/L (ref 135–145)
TCO2: 23 mmol/L (ref 22–32)

## 2021-09-13 LAB — URINALYSIS, ROUTINE W REFLEX MICROSCOPIC
Bacteria, UA: NONE SEEN
Bilirubin Urine: NEGATIVE
Glucose, UA: NEGATIVE mg/dL
Ketones, ur: NEGATIVE mg/dL
Leukocytes,Ua: NEGATIVE
Nitrite: NEGATIVE
Protein, ur: NEGATIVE mg/dL
Specific Gravity, Urine: 1.025 (ref 1.005–1.030)
pH: 6 (ref 5.0–8.0)

## 2021-09-13 LAB — LIPASE, BLOOD: Lipase: 27 U/L (ref 11–51)

## 2021-09-13 LAB — PREGNANCY, URINE: Preg Test, Ur: NEGATIVE

## 2021-09-13 MED ORDER — NAPROXEN 500 MG PO TABS: 500.0000 mg | ORAL_TABLET | Freq: Two times a day (BID) | ORAL | 0 refills | Status: AC

## 2021-09-13 MED ORDER — ONDANSETRON 4 MG PO TBDP
4.0000 mg | ORAL_TABLET | Freq: Once | ORAL | Status: AC
  Administered 2021-09-13: 4 mg via ORAL
  Filled 2021-09-13: qty 1

## 2021-09-13 MED ORDER — IBUPROFEN 800 MG PO TABS
800.0000 mg | ORAL_TABLET | Freq: Once | ORAL | Status: AC
  Administered 2021-09-13: 800 mg via ORAL
  Filled 2021-09-13: qty 1

## 2021-09-13 MED ORDER — ONDANSETRON 4 MG PO TBDP
4.0000 mg | ORAL_TABLET | Freq: Three times a day (TID) | ORAL | 0 refills | Status: AC | PRN
Start: 2021-09-13 — End: ?

## 2021-09-13 MED ORDER — IOHEXOL 300 MG/ML  SOLN
80.0000 mL | Freq: Once | INTRAMUSCULAR | Status: AC | PRN
  Administered 2021-09-13: 80 mL via INTRAVENOUS

## 2021-09-13 MED ORDER — IBUPROFEN 800 MG PO TABS
ORAL_TABLET | ORAL | Status: AC
  Filled 2021-09-13: qty 1

## 2021-09-13 MED ORDER — SODIUM CHLORIDE (PF) 0.9 % IJ SOLN
INTRAMUSCULAR | Status: AC
  Filled 2021-09-13: qty 50

## 2021-09-13 NOTE — ED Provider Notes (Signed)
[facility]-EMERGENCY DEPT Provider Note   CSN: [id number] Arrival date & time: 09/13/21  0941     History  Chief Complaint  Patient presents with   Abdominal Pain    Barbara Wolf is a 26 y.o. female with no significant past medical history who presents to the ED due to right lower quadrant abdominal pain.  Patient states pain started yesterday.  Pain first was located in periumbilical region however, localized to right lower quadrant earlier today.  Abdominal pain associated with nausea however, no vomiting.  She notes she has not eaten or drinking anything since yesterday.  No previous abdominal operations.  No urinary symptoms.  Denies vaginal discharge.  No concern for STIs.  Patient states she was last sexually active 2 months ago.  History obtained from patient and past medical records. No interpreter used during encounter.       Home Medications Prior to Admission medications   Medication Sig Start Date End Date Taking? Authorizing Provider  naproxen (NAPROSYN) 500 MG tablet Take 1 tablet (500 mg total) by mouth 2 (two) times daily. 09/13/21  Yes Shae Augello C, PA-C  ondansetron (ZOFRAN-ODT) 4 MG disintegrating tablet Take 1 tablet (4 mg total) by mouth every 8 (eight) hours as needed for nausea or vomiting. 09/13/21  Yes Mannie Stabile, PA-C      Allergies    Patient has no known allergies.    Review of Systems   Review of Systems  Constitutional:  Negative for chills and fever.  Respiratory:  Negative for shortness of breath.   Cardiovascular:  Negative for chest pain.  Gastrointestinal:  Positive for abdominal pain and nausea. Negative for diarrhea and vomiting.  Genitourinary:  Negative for dysuria and vaginal discharge.    Physical Exam Updated Vital Signs BP 104/76 (BP Location: Left Arm)   Pulse 74   Temp 98.4 F (36.9 C) (Oral)   Resp 16   Ht 5\' 3"  (1.6 m)   Wt 56.7 kg   LMP 09/13/2021   SpO2 100%   BMI 22.14 kg/m   Physical Exam Vitals and nursing note reviewed.  Constitutional:      General: She is not in acute distress.    Appearance: She is not ill-appearing.  HENT:     Head: Normocephalic.  Eyes:     Pupils: Pupils are equal, round, and reactive to light.  Cardiovascular:     Rate and Rhythm: Normal rate and regular rhythm.     Pulses: Normal pulses.     Heart sounds: Normal heart sounds. No murmur heard.    No friction rub. No gallop.  Pulmonary:     Effort: Pulmonary effort is normal.     Breath sounds: Normal breath sounds.  Abdominal:     General: Abdomen is flat. There is no distension.     Palpations: Abdomen is soft.     Tenderness: There is abdominal tenderness. There is no guarding or rebound.     Comments: Mild tenderness to RLQ  Musculoskeletal:        General: Normal range of motion.     Cervical back: Neck supple.  Skin:    General: Skin is warm and dry.  Neurological:     General: No focal deficit present.     Mental Status: She is alert.  Psychiatric:        Mood and Affect: Mood normal.        Behavior: Behavior normal.     ED Results /  Procedures / Treatments   Labs (all labs ordered are listed, but only abnormal results are displayed) Labs Reviewed  URINALYSIS, ROUTINE W REFLEX MICROSCOPIC - Abnormal; Notable for the following components:      Result Value   Hgb urine dipstick LARGE (*)    All other components within normal limits  CBC WITH DIFFERENTIAL/PLATELET  COMPREHENSIVE METABOLIC PANEL  LIPASE, BLOOD  PREGNANCY, URINE  I-STAT CHEM 8, ED    EKG None  Radiology CT Abdomen Pelvis W Contrast  Result Date: 09/13/2021 CLINICAL DATA:  RLQ abdominal pain (Age >= 14y) EXAM: CT ABDOMEN AND PELVIS WITH CONTRAST TECHNIQUE: Multidetector CT imaging of the abdomen and pelvis was performed using the standard protocol following bolus administration of intravenous contrast. RADIATION DOSE REDUCTION: This exam was performed according to the departmental  dose-optimization program which includes automated exposure control, adjustment of the mA and/or kV according to patient size and/or use of iterative reconstruction technique. CONTRAST:  62mL OMNIPAQUE IOHEXOL 300 MG/ML  SOLN COMPARISON:  None Available. FINDINGS: Lower chest: No acute abnormality. Hepatobiliary: No focal liver abnormality is seen. No gallstones, gallbladder wall thickening, or biliary dilatation. Pancreas: Unremarkable. No pancreatic ductal dilatation or surrounding inflammatory changes. Spleen: Normal in size without focal abnormality. Adrenals/Urinary Tract: Adrenal glands are unremarkable. Kidneys are normal, without renal calculi, focal lesion, or hydronephrosis. Bladder is unremarkable. Stomach/Bowel: Stomach is within normal limits. Appendix appears normal. No evidence of bowel wall thickening, distention, or inflammatory changes. Moderate stool burden in the colon. Vascular/Lymphatic: No significant vascular findings are present. No enlarged abdominal or pelvic lymph nodes. Reproductive: Uterus and bilateral adnexa are unremarkable. Other: No abdominal wall hernia or abnormality. No abdominopelvic ascites. Musculoskeletal: No acute or significant osseous findings. IMPRESSION: Unremarkable CT of the abdomen and pelvis with contrast with attention to the right lower quadrant of the abdomen. Electronically Signed   By: Marjo Bicker M.D.   On: 09/13/2021 16:17    Procedures Procedures    Medications Ordered in ED Medications  sodium chloride (PF) 0.9 % injection (  Not Given 09/13/21 1604)  iohexol (OMNIPAQUE) 300 MG/ML solution 80 mL (80 mLs Intravenous Contrast Given 09/13/21 1546)  ibuprofen (ADVIL) tablet 800 mg (800 mg Oral Given 09/13/21 1647)  ondansetron (ZOFRAN-ODT) disintegrating tablet 4 mg (4 mg Oral Given 09/13/21 1647)    ED Course/ Medical Decision Making/ A&P                           Medical Decision Making Amount and/or Complexity of Data Reviewed Independent  Historian: parent    Details: Mother at bedside External Data Reviewed: notes.    Details: Reviewed previous UC note Labs: ordered. Decision-making details documented in ED Course. Radiology: ordered and independent interpretation performed. Decision-making details documented in ED Course.  Risk Prescription drug management.   This patient presents to the ED for concern of abdominal pain, this involves an extensive number of treatment options, and is a complaint that carries with it a high risk of complications and morbidity.  The differential diagnosis includes appendicitis, diverticulitis, bowel obstruction, perforation, acute cholecystitis, PID, etc   26 year old female presents to the ED due to right lower quadrant abdominal pain that started yesterday.  Pain originally periumbilical however, localized earlier today to RLQ.  No previous abdominal operations.  No urinary or vaginal symptoms.  Upon arrival, vitals all within normal limits.  Patient is afebrile, not tachycardic or hypoxic.  Patient in no acute distress.  Reassuring  physical exam.  Very mild tenderness in right lower quadrant.  Abdominal labs and CT abdomen ordered in triage to rule out appendicitis.  Patient declined pain medication. Patient denies concerns for STIs.   CBC unremarkable.  No leukocytosis and normal hemoglobin.  CMP unremarkable.  Normal renal function and no major electrolyte derangements.  Pregnancy test negative. Doubt ectopic. UA significant for large hematuria.  No signs of infection.  Lipase normal.  Doubt pancreatitis.  CT scan personally reviewed and interpreted which is negative for any abnormalities.  No evidence of appendicitis.  No abnormalities in pelvic region.  Offered pelvic exam to rule out any STIs/PID however, patient declined and notes she has very low suspicion for STIs.  Patient given ibuprofen and Zofran prior to discharge.  Advised patient follow-up with PCP if symptoms do not improve over the  next few days. Strict ED precautions discussed with patient. Patient states understanding and agrees to plan. Patient discharged home in no acute distress and stable vitals  Document review of labs and clinical decision tools ie heart score, [id number] etc:1}      Final Clinical Impression(s) / ED Diagnoses Final diagnoses:  Right lower quadrant abdominal pain    Rx / DC Orders ED Discharge Orders          Ordered    ondansetron (ZOFRAN-ODT) 4 MG disintegrating tablet  Every 8 hours PRN        09/13/21 1644    naproxen (NAPROSYN) 500 MG tablet  2 times daily        09/13/21 1644              Jesusita Oka 09/13/21 1647    Lonell Grandchild, MD 09/14/21 [phone]

## 2021-09-13 NOTE — ED Provider Triage Note (Signed)
Emergency Medicine Provider Triage Evaluation Note  Barbara Wolf , a 26 y.o. female  was evaluated in triage.  Pt complains of right lower quadrant abdominal pain.  The pain started yesterday and is periumbilical, localized today.  Associate with nausea but no vomiting.  Has not eaten or drinking since yesterday.  No previous abdominal surgeries..  Review of Systems  Per HPI  Physical Exam  BP 127/88 (BP Location: Left Arm)   Pulse 83   Temp 98.4 F (36.9 C) (Oral)   Resp 18   Ht 5\' 3"  (1.6 m)   Wt 56.7 kg   LMP 09/13/2021   SpO2 100%   BMI 22.14 kg/m  Gen:   Awake, no distress   Resp:  Normal effort  MSK:   Moves extremities without difficulty  Other:    Medical Decision Making  Medically screening exam initiated at 10:22 AM.  Appropriate orders placed.  09/15/2021 was informed that the remainder of the evaluation will be completed by another provider, this initial triage assessment does not replace that evaluation, and the importance of remaining in the ED until their evaluation is complete.     Lona Millard, PA-C 09/13/21 1022

## 2021-09-13 NOTE — ED Triage Notes (Signed)
Pt arrived via POV, c/o RLQ abd pain, worsening with palpation since yesterday. Pt states pain worse this morning. Endorses some nausea, but denies any vomiting, diarrhea or urinary issues. No fevers, no known sick contacts.

## 2021-09-13 NOTE — Discharge Instructions (Addendum)
It was a pleasure taking care of you today. As discussed, your CT scan was normal.  I am sending you home with pain medication and nausea medication.  Take as needed.  Your labs are reassuring.  Please follow-up with PCP if symptoms do not improve over the next few days.  Return to the ER for new or worsening symptoms.

## 2021-10-31 ENCOUNTER — Ambulatory Visit: Payer: Self-pay | Admitting: Family Medicine

## 2022-09-10 ENCOUNTER — Ambulatory Visit: Payer: No Typology Code available for payment source | Admitting: Family Medicine

## 2023-04-10 IMAGING — CR DG CHEST 2V
2 series · 2 of 2 positions shown · non-contrast
Comparison: None.

CLINICAL DATA: chest painPt presents with chest pain, SHOB, left
arm pain and numbness to her neck that began about 45 minutes.
Denies back pain and N/V/D.

EXAM:
CHEST - 2 VIEW

[w chest pa]
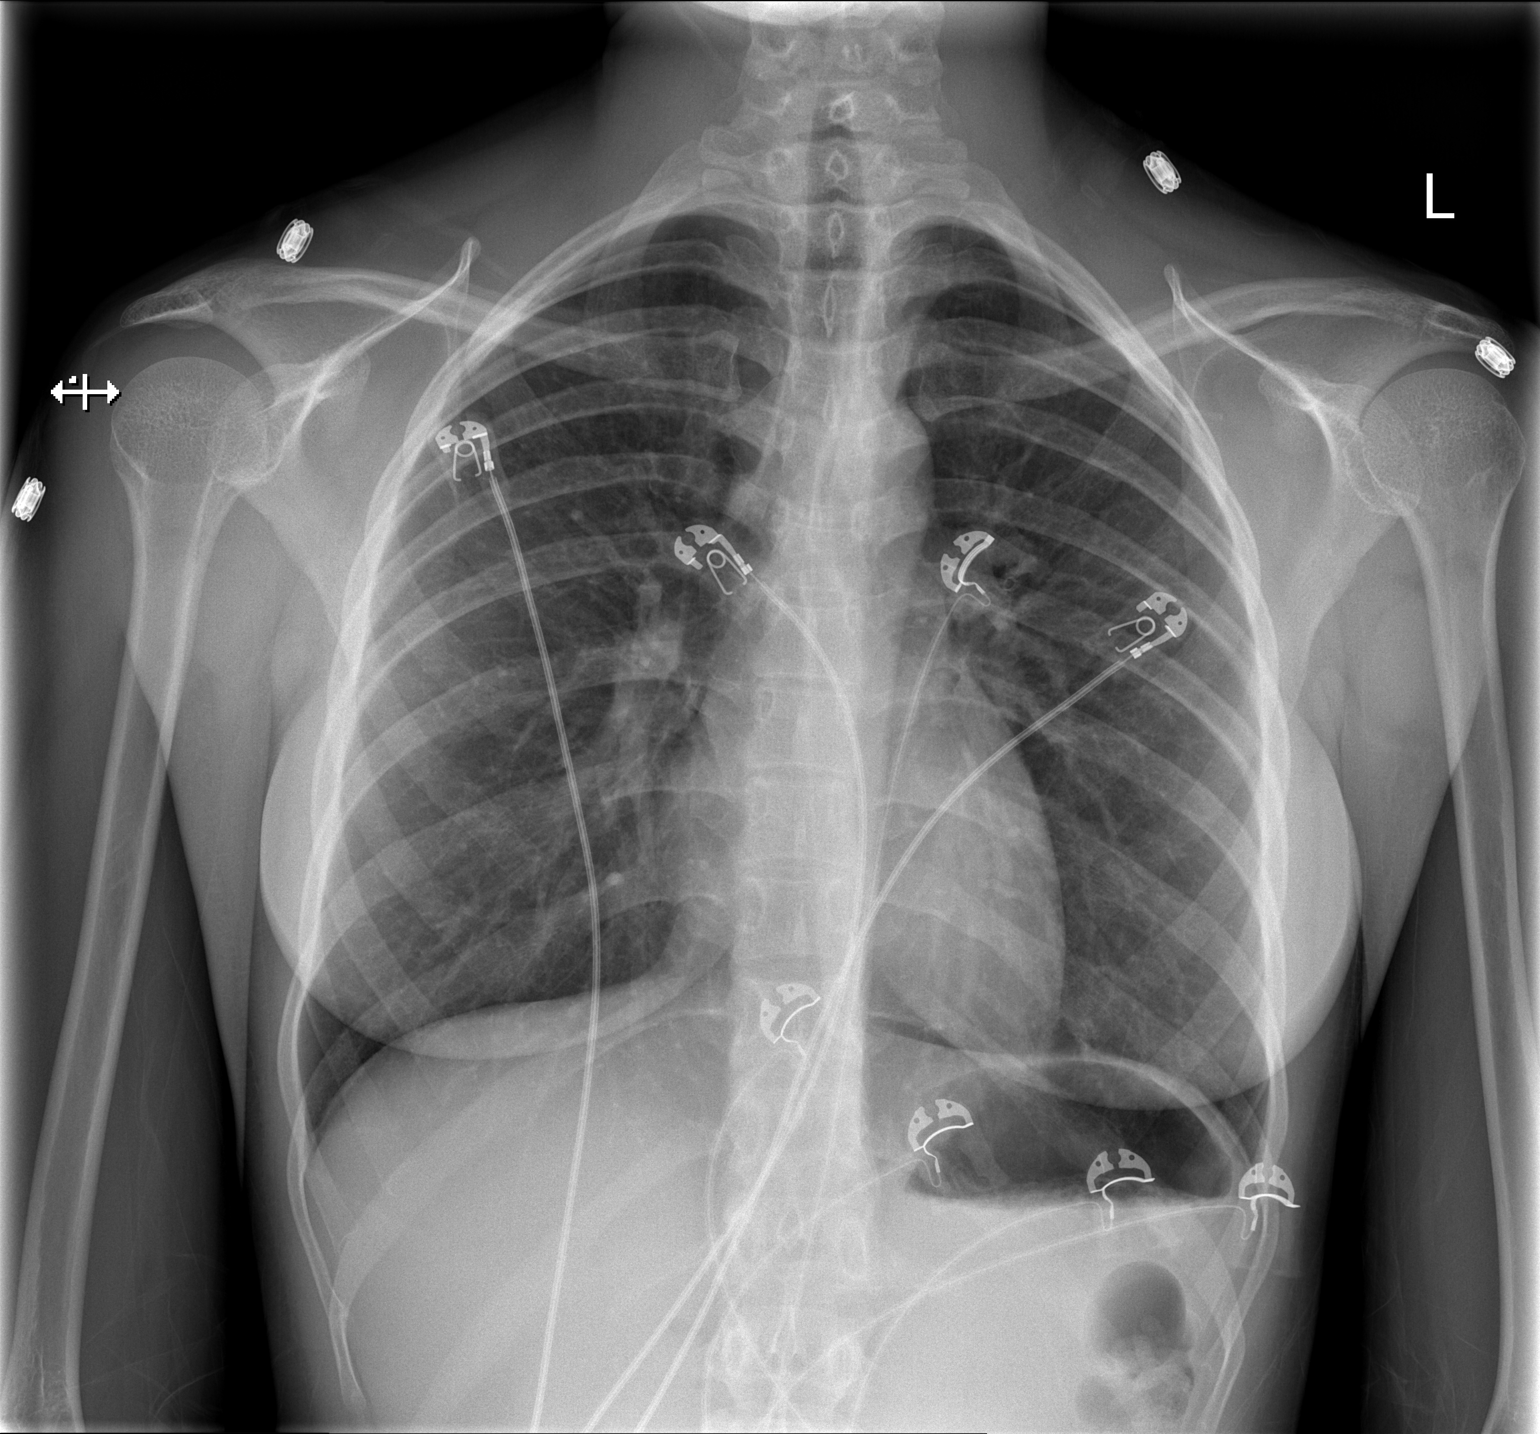

[w chest lat]
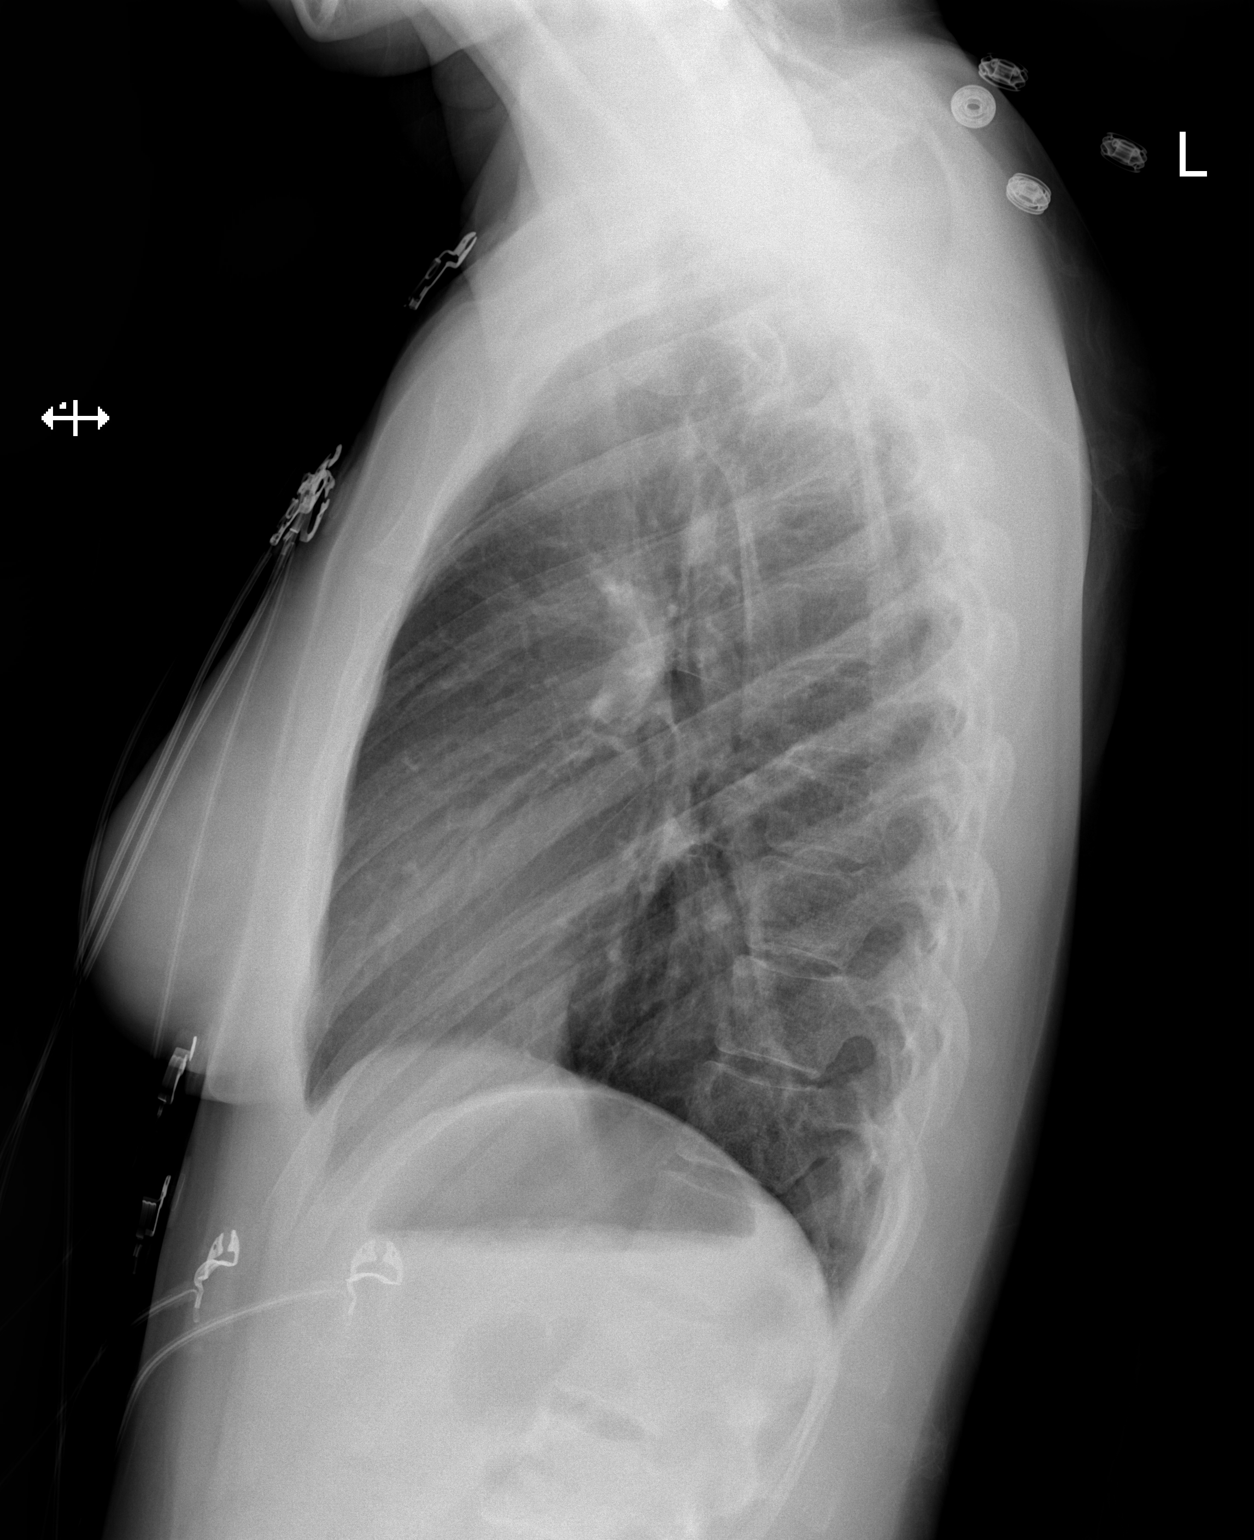

[2 of 2 positions shown; findings below may reference images not displayed]

FINDINGS: Numerous leads and wires project over the chest. Midline trachea.
Normal heart size and mediastinal contours. No pleural effusion or
pneumothorax. Clear lungs.
IMPRESSION: No active cardiopulmonary disease.

## 2023-10-31 ENCOUNTER — Ambulatory Visit (HOSPITAL_COMMUNITY): Payer: Self-pay
